# Patient Record
Sex: Female | Born: 2000 | Race: Black or African American | Hispanic: No | Marital: Single | Smoking: Never smoker
Health system: Southern US, Community
[De-identification: ages and names within clinical notes are randomized; demographics above are authoritative.]

---
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
## 2021-06-04 LAB — OB RESULTS CONSOLE ABO/RH: RH Type: POSITIVE

## 2021-06-18 LAB — HEPATITIS C ANTIBODY: HCV Ab: NEGATIVE

## 2021-06-18 LAB — OB RESULTS CONSOLE RPR: RPR: NONREACTIVE

## 2021-06-18 LAB — OB RESULTS CONSOLE ANTIBODY SCREEN: Antibody Screen: NEGATIVE

## 2021-06-18 LAB — OB RESULTS CONSOLE RUBELLA ANTIBODY, IGM: Rubella: IMMUNE

## 2021-06-18 LAB — OB RESULTS CONSOLE HIV ANTIBODY (ROUTINE TESTING): HIV: NONREACTIVE

## 2021-06-18 LAB — OB RESULTS CONSOLE HEPATITIS B SURFACE ANTIGEN: Hepatitis B Surface Ag: NEGATIVE

## 2021-06-23 LAB — OB RESULTS CONSOLE GC/CHLAMYDIA
Chlamydia: NEGATIVE
Neisseria Gonorrhea: NEGATIVE

## 2021-09-16 ENCOUNTER — Other Ambulatory Visit: Payer: Self-pay | Admitting: Obstetrics and Gynecology

## 2021-09-16 DIAGNOSIS — Z3689 Encounter for other specified antenatal screening: Secondary | ICD-10-CM

## 2021-09-18 ENCOUNTER — Other Ambulatory Visit: Payer: Self-pay

## 2021-09-19 ENCOUNTER — Ambulatory Visit: Payer: Medicaid Other | Attending: Obstetrics and Gynecology

## 2021-09-19 ENCOUNTER — Ambulatory Visit: Payer: Medicaid Other

## 2021-10-14 ENCOUNTER — Other Ambulatory Visit: Payer: Self-pay

## 2021-10-17 ENCOUNTER — Ambulatory Visit: Payer: 59 | Attending: Obstetrics and Gynecology | Admitting: *Deleted

## 2021-10-17 ENCOUNTER — Ambulatory Visit (HOSPITAL_BASED_OUTPATIENT_CLINIC_OR_DEPARTMENT_OTHER): Payer: 59

## 2021-10-17 DIAGNOSIS — Z363 Encounter for antenatal screening for malformations: Secondary | ICD-10-CM | POA: Diagnosis not present

## 2021-10-17 DIAGNOSIS — Z3689 Encounter for other specified antenatal screening: Secondary | ICD-10-CM

## 2021-10-17 DIAGNOSIS — O99212 Obesity complicating pregnancy, second trimester: Secondary | ICD-10-CM | POA: Diagnosis present

## 2021-10-17 DIAGNOSIS — Z3A26 26 weeks gestation of pregnancy: Secondary | ICD-10-CM | POA: Diagnosis not present

## 2022-01-13 ENCOUNTER — Other Ambulatory Visit: Payer: Self-pay | Admitting: Obstetrics and Gynecology

## 2022-01-15 ENCOUNTER — Telehealth (HOSPITAL_COMMUNITY): Payer: Self-pay | Admitting: *Deleted

## 2022-01-15 NOTE — Telephone Encounter (Signed)
Preadmission screen  

## 2022-01-16 ENCOUNTER — Telehealth (HOSPITAL_COMMUNITY): Payer: Self-pay | Admitting: *Deleted

## 2022-01-16 ENCOUNTER — Encounter (HOSPITAL_COMMUNITY): Payer: Self-pay | Admitting: *Deleted

## 2022-01-16 NOTE — Telephone Encounter (Signed)
Preadmission screen  

## 2022-01-22 ENCOUNTER — Inpatient Hospital Stay (HOSPITAL_COMMUNITY): Payer: Medicaid Other

## 2022-01-22 ENCOUNTER — Inpatient Hospital Stay (HOSPITAL_COMMUNITY)
Admission: RE | Admit: 2022-01-22 | Payer: Medicaid Other | Source: Home / Self Care | Admitting: Obstetrics and Gynecology

## 2022-01-22 NOTE — H&P (Deleted)
Krystal Lynch is a 21 y.o. female G1P0000 at [redacted]w[redacted]d presenting for IOL. Pregnancy complicated by maternal obesity and thrombocytosis.  OB History     Gravida  1   Para      Term      Preterm      AB      Living         SAB      IAB      Ectopic      Multiple      Live Births             No past medical history on file. No past surgical history on file. Family History: family history is not on file. Social History:  reports that she has never smoked. She has never used smokeless tobacco. She reports that she does not drink alcohol and does not use drugs.     Maternal Diabetes: No Genetic Screening: Normal Maternal Ultrasounds/Referrals: Normal Fetal Ultrasounds or other Referrals:  None Maternal Substance Abuse:  No Significant Maternal Medications:  None Significant Maternal Lab Results:  Group B Strep negative Number of Prenatal Visits:greater than 3 verified prenatal visits Other Comments:  None  Review of Systems  Constitutional: Negative.   HENT: Negative.    Eyes: Negative.   Respiratory: Negative.    Cardiovascular: Negative.   Gastrointestinal: Negative.   Genitourinary: Negative.   All other systems reviewed and are negative.  History   Last menstrual period 04/17/2021. Exam Physical Exam Vitals reviewed.  Constitutional:      Appearance: Normal appearance. She is obese.  Cardiovascular:     Rate and Rhythm: Normal rate.  Pulmonary:     Effort: Pulmonary effort is normal. No respiratory distress.  Abdominal:     General: There is no distension.     Palpations: Abdomen is soft.     Tenderness: There is no abdominal tenderness.     Comments: gravid  Neurological:     General: No focal deficit present.     Mental Status: She is alert and oriented to person, place, and time.  Psychiatric:        Mood and Affect: Mood normal.     Prenatal labs: ABO, Rh: O/Positive/-- (04/19 0000) Antibody: Negative (05/03 0000) Rubella: Immune  (05/03 0000) RPR: Nonreactive (05/03 0000)  HBsAg: Negative (05/03 0000)  HIV: Non-reactive (05/03 0000)  GBS:   Negative  Assessment/Plan: 21 y.o. female G1P0000 at [redacted]w[redacted]d IOL Maternal Obesity Thrombocytosis  Admit to L&D General admission labs Check cervix on presentation  Plan for IOL pending cervical check CLD CEFM/TOCO IV fluids   Krystal Lynch D Krystal Lynch 01/22/2022, 9:23 AM
